# Patient Record
Sex: Male | Born: 1965 | Race: Black or African American | Hispanic: No | Marital: Married | Smoking: Never smoker
Health system: Southern US, Community
[De-identification: ages and names within clinical notes are randomized; demographics above are authoritative.]

## PROBLEM LIST (undated history)

## (undated) DIAGNOSIS — I10 Essential (primary) hypertension: Secondary | ICD-10-CM

## (undated) HISTORY — DX: Essential (primary) hypertension: I10

## (undated) HISTORY — PX: OTHER SURGICAL HISTORY: SHX169

---

## 2018-07-19 ENCOUNTER — Emergency Department (HOSPITAL_COMMUNITY)
Admission: EM | Admit: 2018-07-19 | Discharge: 2018-07-19 | Disposition: A | Discharge status: Home / Self Care | Payer: Self-pay | Attending: Emergency Medicine | Admitting: Emergency Medicine

## 2018-07-19 ENCOUNTER — Encounter (HOSPITAL_COMMUNITY): Payer: Self-pay | Admitting: Emergency Medicine

## 2018-07-19 ENCOUNTER — Other Ambulatory Visit: Payer: Self-pay

## 2018-07-19 ENCOUNTER — Emergency Department (HOSPITAL_COMMUNITY): Payer: Self-pay

## 2018-07-19 DIAGNOSIS — Z87891 Personal history of nicotine dependence: Secondary | ICD-10-CM | POA: Insufficient documentation

## 2018-07-19 DIAGNOSIS — R002 Palpitations: Secondary | ICD-10-CM | POA: Insufficient documentation

## 2018-07-19 DIAGNOSIS — Z79899 Other long term (current) drug therapy: Secondary | ICD-10-CM | POA: Insufficient documentation

## 2018-07-19 DIAGNOSIS — I10 Essential (primary) hypertension: Secondary | ICD-10-CM | POA: Insufficient documentation

## 2018-07-19 LAB — BASIC METABOLIC PANEL
Anion gap: 8 (ref 5–15)
BUN: 9 mg/dL (ref 6–20)
CHLORIDE: 107 mmol/L (ref 98–111)
CO2: 22 mmol/L (ref 22–32)
Calcium: 9 mg/dL (ref 8.9–10.3)
Creatinine, Ser: 0.98 mg/dL (ref 0.61–1.24)
GFR calc non Af Amer: >60 mL/min (ref >60)
Glucose, Bld: 143 mg/dL — ABNORMAL HIGH (ref 70–99)
POTASSIUM: 3.8 mmol/L (ref 3.5–5.1)
SODIUM: 137 mmol/L (ref 135–145)

## 2018-07-19 LAB — CBC
HEMATOCRIT: 43.3 % (ref 39.0–52.0)
Hemoglobin: 14.7 g/dL (ref 13.0–17.0)
MCH: 31.5 pg (ref 26.0–34.0)
MCHC: 33.9 g/dL (ref 30.0–36.0)
MCV: 92.7 fL (ref 80.0–100.0)
NRBC: 0 % (ref 0.0–0.2)
Platelets: 332 10*3/uL (ref 150–400)
RBC: 4.67 MIL/uL (ref 4.22–5.81)
RDW: 12.1 % (ref 11.5–15.5)
WBC: 8.1 10*3/uL (ref 4.0–10.5)

## 2018-07-19 LAB — TSH: TSH: 0.521 u[IU]/mL (ref 0.350–4.500)

## 2018-07-19 LAB — I-STAT TROPONIN, ED: Troponin i, poc: 0 ng/mL (ref 0.00–0.08)

## 2018-07-19 MED ORDER — METOPROLOL SUCCINATE ER 25 MG PO TB24: 12.5000 mg | ORAL_TABLET | Freq: Every day | ORAL | 0 refills | Status: DC

## 2018-07-19 NOTE — ED Notes (Signed)
Patient verbalizes understanding of discharge instructions. Opportunity for questioning and answers were provided. Armband removed by staff, pt discharged from ED ambulatory to home.  

## 2018-07-19 NOTE — Discharge Instructions (Signed)
1.  Call [facility] to schedule a follow-up appointment as soon as possible.  Use the referral number in your discharge instructions to find a family doctor for follow-up as soon as possible. 2.  Start taking Toprol-XL 12.5 mg daily.  Continue to monitor your blood pressures at home.  If you are feeling lightheaded and your blood pressures are less than 120s over 70s, discontinue the Toprol.  You should also monitor your heart rate and heart rate is going below 50 or you feel lightheaded or dizzy with heart rate less than 70, discontinue the Toprol. 3.  Return to the emergency department if you are having recurrence of symptoms, chest pain, passing out or near passing out episodes or other concerning symptoms.

## 2018-07-19 NOTE — ED Triage Notes (Signed)
PT took his aunts metoprolol at 2am

## 2018-07-19 NOTE — ED Triage Notes (Signed)
PT reports palpitations and HTN for 3-4 days.   PT reports he has been checking his BP at home.  180/110 was the highest reading at home. PT has never seen a doctor as an adult.   PT reports high HR last night and chest pain. This has been intermittent for past few days. Last night was the worst.   PT denies SOB, diaphoresis, other pain, nausea.

## 2018-07-19 NOTE — ED Provider Notes (Signed)
[facility]ENC[facility] Provider Note   CSN: [id number] Arrival date & time: 07/19/18  [id number]     History   Chief Complaint Chief Complaint  Patient presents with  . Hypertension    HPI Aaron Dillon is a 52 y.o. male.  HPI Patient reports he has been monitoring his blood pressure at home this week on a home monitor.  He reports blood pressures at home have been as high as 180\110.  He reports that for the last couple days now he has been noticing that he is gotten periods of time where he can feel his heart skipping or beating.  He reports last night it was particularly more pronounced.  That made him worried and he has decided to seek medical care.  Patient reports that he has not seen a doctor as an adult.  He has been otherwise healthy.  He reports that he took 1 of his family members metoprolol tablets yesterday evening and an aspirin.  He reports his symptoms did seem improved.  His blood pressures come down.  He is not currently having palpitations.  He denies he had any chest pain.  He denies that he had any syncope or near syncope.  He denies any associated shortness of breath, nausea or sweating.  Patient reports he does drink a moderate amount of alcohol.  Predominantly socially.  He reports he had been cutting back over the past few weeks because he had noticed he is getting a bit of a belly and thought he needed to be more conservative with his diet.  He reports on the recommendation of family members he did have a alcohol beverage last night to theoretically improve his blood pressure. History reviewed. No pertinent past medical history.  There are no active problems to display for this patient.   History reviewed. No pertinent surgical history.      Home Medications    Prior to Admission medications   Medication Sig Start Date End Date Taking? Authorizing Provider  metoprolol succinate (TOPROL-XL) 25 MG 24 hr tablet Take 0.5 tablets  (12.5 mg total) by mouth daily. 07/19/18   Arby BarrettePfeiffer, Maicy Filip, MD    Family History No family history on file.  Social History Social History   Tobacco Use  . Smoking status: Former Smoker  Substance Use Topics  . Alcohol use: Yes    Comment: last night   . Drug use: Yes    Types: Marijuana     Allergies   Patient has no known allergies.   Review of Systems Review of Systems 10 Systems reviewed and are negative for acute change except as noted in the HPI.\  Physical Exam Updated Vital Signs BP 127/76 (BP Location: Right Arm)   Pulse (!) 58   Temp 98.2 F (36.8 C) (Oral)   Resp (!) 21   Wt 99.8 kg   SpO2 99%   Physical Exam  Constitutional: He is oriented to person, place, and time. He appears well-developed and well-nourished. No distress.  HENT:  Head: Normocephalic and atraumatic.  Mouth/Throat: Oropharynx is clear and moist.  Eyes: Pupils are equal, round, and reactive to light. EOM are normal.  Neck: Neck supple.  Cardiovascular: Normal rate, regular rhythm, normal heart sounds and intact distal pulses.  Pulmonary/Chest: Effort normal and breath sounds normal.  Abdominal: Soft. Bowel sounds are normal. He exhibits no distension. There is no tenderness.  Musculoskeletal: Normal range of motion. He exhibits no edema.  Neurological: He is alert and oriented  to person, place, and time. He has normal strength. Coordination normal. GCS eye subscore is 4. GCS verbal subscore is 5. GCS motor subscore is 6.  Skin: Skin is warm, dry and intact.  Psychiatric: He has a normal mood and affect.     ED Treatments / Results  Labs (all labs ordered are listed, but only abnormal results are displayed) Labs Reviewed  BASIC METABOLIC PANEL - Abnormal; Notable for the following components:      Result Value   Glucose, Bld 143 (*)    All other components within normal limits  CBC  TSH  I-STAT TROPONIN, ED    EKG EKG Interpretation  Date/Time:  Thursday July 19 2018 09:45:58 EST Ventricular Rate:  82 PR Interval:    QRS Duration: 83 QT Interval:  390 QTC Calculation: 456 R Axis:   72 Text Interpretation:  Sinus rhythm normal, no old comparison Confirmed by Arby Barrette [phone]) on 07/19/2018 10:10:31 AM Also confirmed by Arby Barrette [phone]), editor Barbette Hair [phone])  on 07/19/2018 12:26:28 PM   Radiology Dg Chest 2 View  Result Date: 07/19/2018 CLINICAL DATA:  Chest pain EXAM: CHEST - 2 VIEW COMPARISON:  None. FINDINGS: Cardiomegaly. Minimal bibasilar atelectasis and low lung volumes. No effusions or overt edema. No acute bony abnormality. IMPRESSION: Mild cardiomegaly.  Low lung volumes with bibasilar atelectasis. Electronically Signed   By: Charlett Nose M.D.   On: 07/19/2018 10:56    Procedures Procedures (including critical care time)  Medications Ordered in ED Medications - No data to display   Initial Impression / Assessment and Plan / ED Course  I have reviewed the triage vital signs and the nursing notes.  Pertinent labs & imaging results that were available during my care of the patient were reviewed by me and considered in my medical decision making (see chart for details).    Patient is clinically well in appearance.  He did take her metoprolol yesterday.  Blood pressures have normalized.  He was getting elevated pressures greater than diastolic 170s and systolic 110s.  Now blood pressures are normalized and there are no ectopic beats on monitor.  Patient is asymptomatic.  His description of palpitations did not sound suggestive of ischemia or significant dysrhythmia.  He had no associated near syncope nor lightheadedness nor dyspnea nor chest pain.  As he seems to be tolerating the metoprolol well, I will have him continue this with the parameters of monitoring heart rate and blood pressure at home.  He is counseled on necessity of close follow-up and referral advice is provided.  Final Clinical Impressions(s) / ED  Diagnoses   Final diagnoses:  Essential hypertension  Palpitation    ED Discharge Orders         Ordered    Ambulatory referral to Cardiology     07/19/18 1234    metoprolol succinate (TOPROL-XL) 25 MG 24 hr tablet  Daily     07/19/18 1234           Arby Barrette, MD 07/19/18 1252

## 2018-08-22 ENCOUNTER — Ambulatory Visit (INDEPENDENT_AMBULATORY_CARE_PROVIDER_SITE_OTHER): Payer: Self-pay | Admitting: Cardiology

## 2018-08-22 ENCOUNTER — Encounter: Payer: Self-pay | Admitting: Cardiology

## 2018-08-22 ENCOUNTER — Encounter (INDEPENDENT_AMBULATORY_CARE_PROVIDER_SITE_OTHER): Payer: Self-pay

## 2018-08-22 VITALS — BP 146/90 | HR 77 | Ht 69.0 in | Wt 210.0 lb

## 2018-08-22 DIAGNOSIS — I10 Essential (primary) hypertension: Secondary | ICD-10-CM

## 2018-08-22 DIAGNOSIS — Z8249 Family history of ischemic heart disease and other diseases of the circulatory system: Secondary | ICD-10-CM

## 2018-08-22 DIAGNOSIS — R002 Palpitations: Secondary | ICD-10-CM

## 2018-08-22 MED ORDER — CARVEDILOL 6.25 MG PO TABS: 6.2500 mg | ORAL_TABLET | Freq: Two times a day (BID) | ORAL | 1 refills | Status: DC

## 2018-08-22 NOTE — Progress Notes (Signed)
`  PCP: System, Pcp Not In  Clinic Note: Chief Complaint  Patient presents with  . Hospitalization Follow-up    ER follow-up with hypertension and palpitations    HPI: Aaron Dillon is a 52 y.o. male with a PMH below who presents today for ER f/u for evaluation of hypertension and palpitations.Aaron Dillon.  Haitham Buelow has not yet established a primary care physician here locally.  He has been trying to establish in [place] and is planning to move here full-term.  He has not started a job full-time because he is about ready to leave to go to [place] to pick up his girlfriend at the end of the month to bring her back.  He has been doing intermittent jobs with a temp company.  Hopefully when he gets back he can get a more full-time job.  He has been told he has high blood pressure in the past, but has not been on any medications.  Recent Hospitalizations:   ER visit 07/19/18 -noted blood pressures as high as 180/1 110 mmHg.  This was going on for the last several days and was also noticing episodes of heart skipping and pounding.  He did take 1 of his family members metoprolol tablets when he noticed this happening to him and it seemed to settle things a little bit.  No other symptoms. -->  Referred for cardiology evaluation as opposed to PCP.  Studies Personally Reviewed - (if available, images/films reviewed: From Epic Chart or Care Everywhere)  None  Interval History: Aaron Dillon presents here today for follow-up.  He says that since starting the low-dose beta-blocker, the palpitations seem to have gotten better.  His blood pressures have not been as high.  He denies any headache or blurred vision or dizziness associated with it.  He has been under quite a bit of stress though because he is worried about his job situation especially with him having to go out to [place] to get his girlfriend to bring her back here.  He is worried that it is hard to set up a new home without a  job. He is try to cut down on his alcohol intake, partially because of cost and also because of weight gain.  He does not smoke cigarettes, but does smoke marijuana.  Even that he is cut back on mostly because of cost issues. He still has intermittent palpitations although the metoprolol is making it better.  He is not necessarily noticing fast heart rates, just frequent skipped beats that happened just about every day.  Sometimes they are more worrisome than others, but he just tries to put them out of his mind. He may get little short of breath and is walking around, but he does indicate that he is quite deconditioned without doing any real exercise.  No PND orthopnea and no edema.  Despite having the palpitations he is not had any syncope or near syncope type symptoms.  No TIA or amaurosis fugax. He has not had any chest tightness pressure with rest or exertion.  No PND, orthopnea or edema.  No claudication  ROS: A comprehensive was performed. Review of Systems  Constitutional: Negative for malaise/fatigue and weight loss.  HENT: Negative for nosebleeds.        He has several bad teeth that need to get fixed.  Respiratory: Positive for shortness of breath (Mostly if he overexerts because of deconditioning).   Gastrointestinal: Negative for abdominal pain, blood in stool, heartburn and melena.  Genitourinary: Negative for hematuria.  Musculoskeletal:       Off-and-on joint pains  Neurological: Negative for dizziness, focal weakness and headaches.  Psychiatric/Behavioral:       Lots of social stress with his job issue and moving his girlfriend out to [place]  All other systems reviewed and are negative.    I have reviewed and (if needed) personally updated the patient's problem list, medications, allergies, past medical and surgical history, social and family history.   Past Medical History:  Diagnosis Date  . Hypertension     Past Surgical History:  Procedure Laterality Date   . none      Current Meds  Medication Sig  . [DISCONTINUED] metoprolol succinate (TOPROL-XL) 25 MG 24 hr tablet Take 0.5 tablets (12.5 mg total) by mouth daily.    No Known Allergies  Social History   Tobacco Use  . Smoking status: Never Smoker  . Smokeless tobacco: Never Used  Substance Use Topics  . Alcohol use: Yes    Comment: last night  pt states he occasionally drinks beer and wine  . Drug use: Yes    Types: Marijuana    Comment: only smokes Marijuana - no regular cigarettes.   Social History   Social History Narrative   Recently moved to [place] -- working with [employer]. Currently unemployed.   Planning to go out to [place] to pick up his girlfriend & drive back here with her in early Jan 2020.   Hopes to get full time job after that.    family history includes Brain cancer in his mother; Breast cancer in his maternal grandmother; Heart attack (age of onset: 33) in his maternal grandfather.  Wt Readings from Last 3 Encounters:  08/22/18 210 lb (95.3 kg)  07/19/18 220 lb (99.8 kg)    PHYSICAL EXAM BP (!) 146/90   Pulse 77   Ht 5\' 9"  (1.753 m)   Wt 210 lb (95.3 kg)   BMI 31.01 kg/m  Physical Exam  Constitutional: He is oriented to person, place, and time. He appears well-developed and well-nourished. No distress.  Healthy-appearing.  Well-groomed.  HENT:  Head: Normocephalic and atraumatic.  Mouth/Throat: Oropharynx is clear and moist.  Relatively poor dentition.  Several areas of obvious tooth decay  Eyes: Pupils are equal, round, and reactive to light. Conjunctivae are normal. No scleral icterus.  Neck: Normal range of motion. Neck supple. No hepatojugular reflux and no JVD present. Carotid bruit is not present. No tracheal deviation present. No thyromegaly present.  Cardiovascular: Normal rate, regular rhythm, normal heart sounds and intact distal pulses.  Extrasystoles (Rare) are present. PMI is not displaced. Exam reveals no gallop and no friction  rub.  No murmur heard. Pulmonary/Chest: Effort normal. No respiratory distress. He has no wheezes. He has no rales.  Abdominal: Soft. Bowel sounds are normal. He exhibits no distension. There is no abdominal tenderness. There is no rebound.  Musculoskeletal: Normal range of motion.        General: No edema.  Neurological: He is alert and oriented to person, place, and time. No cranial nerve deficit.  Skin: Skin is warm and dry.  Multiple tattoos  Vitals reviewed.    Adult ECG Report  Rate: 72 ;  Rhythm: normal sinus rhythm and borderline LVH.;   Narrative Interpretation: Relatively normal EKG   Other studies Reviewed: Additional studies/ records that were reviewed today include:  Recent Labs:   Lab Results  Component Value Date   CREATININE 0.98 07/19/2018   BUN 9 07/19/2018  NA 137 07/19/2018   K 3.8 07/19/2018   CL 107 07/19/2018   CO2 22 07/19/2018   No results found for: CHOL, HDL, LDLCALC, LDLDIRECT, TRIG, CHOLHDL    ASSESSMENT / PLAN: Problem List Items Addressed This Visit    Essential hypertension (Chronic)    Blood pressure looks okay, but he is only on very low-dose Toprol.  I am the switch him from metoprolol to carvedilol for better blood pressure control. Plan: Carvedilol 6.25 mg twice daily      Relevant Medications   carvedilol (COREG) 6.25 MG tablet   Other Relevant Orders   Lipid panel   Family history of cardiovascular disease    With hypertension and family history of CAD, should do at least baseline evaluation with lipid panel.      Relevant Orders   Lipid panel   Comprehensive Metabolic Panel (CMET)   Palpitations - Primary    Relatively frequent although improved with low-dose beta-blocker, I think we may get better with increased dose of carvedilol, but will evaluate with a 24-hour monitor. Depending on results, may need to consider echo.      Relevant Orders   EKG 12-Lead (Completed)   HOLTER MONITOR - 24 HOUR   Lipid panel    Comprehensive Metabolic Panel (CMET)      I spent a total of with the patient and chart review. >  50% of the time was spent in direct patient consultation.   Current medicines are reviewed at length with the patient today.  (+/- concerns) n/a The following changes have been made:  see below   Patient Instructions  Medication Instructions:    FINISH TAKING THE TOPROL XL THEN START  TAKING CARVEDILOL (COREG) 6.25 MG TWICE A DAY  If you need a refill on your cardiac medications before your next appointment, please call your pharmacy.   Lab work: CMP  LIPID - DO NOT EAT OR DRINK THE MORNING OF THE TEST  If you have labs (blood work) drawn today and your tests are completely normal, you will receive your results only by: Marland Kitchen MyChart Message (if you have MyChart) OR . A paper copy in the mail If you have any lab test that is abnormal or we need to change your treatment, we will call you to review the results.  Testing/Procedures: SCHEDULE AT [address] Your physician has recommended that you wear a  24 HOUR  holter monitor. Holter monitors are medical devices that record the heart's electrical activity. Doctors most often use these monitors to diagnose arrhythmias. Arrhythmias are problems with the speed or rhythm of the heartbeat. The monitor is a small, portable device. You can wear one while you do your normal daily activities. This is usually used to diagnose what is causing palpitations/syncope (passing out).    Follow-Up: At [facility], you and your health needs are our priority.  As part of our continuing mission to provide you with exceptional heart care, we have created designated Provider Care Teams.  These Care Teams include your primary Cardiologist (physician) and Advanced Practice Providers (APPs -  Physician Assistants and Nurse Practitioners) who all work together to provide you with the care you need, when you need it. . Your  physician recommends that you schedule a follow-up appointment in 3 MONTHS WITH DR HARDING .   Any Other Special Instructions Will Be Listed Below (If Applicable).    Studies Ordered:   Orders Placed This Encounter  Procedures  .  Lipid panel  . Comprehensive Metabolic Panel (CMET)  . HOLTER MONITOR - 24 HOUR  . EKG 12-Lead      Bryan Lemma, M.D., M.S. Interventional Cardiologist   Pager # [phone] Phone # [phone] [address] [place], [place] [address]   Thank you for choosing Heartcare at [facility]!!

## 2018-08-22 NOTE — Patient Instructions (Signed)
Medication Instructions:    FINISH TAKING THE TOPROL XL THEN START  TAKING CARVEDILOL (COREG) 6.25 MG TWICE A DAY  If you need a refill on your cardiac medications before your next appointment, please call your pharmacy.   Lab work: CMP  LIPID - DO NOT EAT OR DRINK THE MORNING OF THE TEST  If you have labs (blood work) drawn today and your tests are completely normal, you will receive your results only by: Marland Kitchen. MyChart Message (if you have MyChart) OR . A paper copy in the mail If you have any lab test that is abnormal or we need to change your treatment, we will call you to review the results.  Testing/Procedures: SCHEDULE AT [address] Your physician has recommended that you wear a  24 HOUR  holter monitor. Holter monitors are medical devices that record the heart's electrical activity. Doctors most often use these monitors to diagnose arrhythmias. Arrhythmias are problems with the speed or rhythm of the heartbeat. The monitor is a small, portable device. You can wear one while you do your normal daily activities. This is usually used to diagnose what is causing palpitations/syncope (passing out).    Follow-Up: At [facility], you and your health needs are our priority.  As part of our continuing mission to provide you with exceptional heart care, we have created designated Provider Care Teams.  These Care Teams include your primary Cardiologist (physician) and Advanced Practice Providers (APPs -  Physician Assistants and Nurse Practitioners) who all work together to provide you with the care you need, when you need it. . Your physician recommends that you schedule a follow-up appointment in 3 MONTHS WITH DR HARDING .   Any Other Special Instructions Will Be Listed Below (If Applicable).

## 2018-08-24 ENCOUNTER — Ambulatory Visit (INDEPENDENT_AMBULATORY_CARE_PROVIDER_SITE_OTHER): Payer: Self-pay

## 2018-08-24 ENCOUNTER — Encounter: Payer: Self-pay | Admitting: Cardiology

## 2018-08-24 DIAGNOSIS — R002 Palpitations: Secondary | ICD-10-CM | POA: Insufficient documentation

## 2018-08-24 DIAGNOSIS — Z8249 Family history of ischemic heart disease and other diseases of the circulatory system: Secondary | ICD-10-CM | POA: Insufficient documentation

## 2018-08-24 NOTE — Assessment & Plan Note (Signed)
Relatively frequent although improved with low-dose beta-blocker, I think we may get better with increased dose of carvedilol, but will evaluate with a 24-hour monitor. Depending on results, may need to consider echo.

## 2018-08-24 NOTE — Assessment & Plan Note (Signed)
Blood pressure looks okay, but he is only on very low-dose Toprol.  I am the switch him from metoprolol to carvedilol for better blood pressure control. Plan: Carvedilol 6.25 mg twice daily

## 2018-08-24 NOTE — Assessment & Plan Note (Signed)
With hypertension and family history of CAD, should do at least baseline evaluation with lipid panel.

## 2018-08-28 LAB — COMPREHENSIVE METABOLIC PANEL
A/G RATIO: 2.1 (ref 1.2–2.2)
ALT: 33 IU/L (ref 0–44)
AST: 18 IU/L (ref 0–40)
Albumin: 4.9 g/dL (ref 3.5–5.5)
Alkaline Phosphatase: 59 IU/L (ref 39–117)
BUN/Creatinine Ratio: 14 (ref 9–20)
BUN: 13 mg/dL (ref 6–24)
Bilirubin Total: 0.3 mg/dL (ref 0.0–1.2)
CALCIUM: 9.8 mg/dL (ref 8.7–10.2)
CO2: 22 mmol/L (ref 20–29)
Chloride: 100 mmol/L (ref 96–106)
Creatinine, Ser: 0.9 mg/dL (ref 0.76–1.27)
GFR, EST AFRICAN AMERICAN: 112 mL/min/{1.73_m2} (ref >59)
GFR, EST NON AFRICAN AMERICAN: 97 mL/min/{1.73_m2} (ref >59)
Globulin, Total: 2.3 g/dL (ref 1.5–4.5)
Glucose: 119 mg/dL — ABNORMAL HIGH (ref 65–99)
Potassium: 4.2 mmol/L (ref 3.5–5.2)
Sodium: 140 mmol/L (ref 134–144)
Total Protein: 7.2 g/dL (ref 6.0–8.5)

## 2018-08-28 LAB — LIPID PANEL
CHOL/HDL RATIO: 3.4 ratio (ref 0.0–5.0)
Cholesterol, Total: 200 mg/dL — ABNORMAL HIGH (ref 100–199)
HDL: 59 mg/dL (ref >39)
LDL Calculated: 114 mg/dL — ABNORMAL HIGH (ref 0–99)
TRIGLYCERIDES: 136 mg/dL (ref 0–149)
VLDL Cholesterol Cal: 27 mg/dL (ref 5–40)

## 2018-09-04 ENCOUNTER — Telehealth: Payer: Self-pay | Admitting: *Deleted

## 2018-09-04 NOTE — Telephone Encounter (Signed)
called to  Leave message - memory full on answering machine

## 2018-09-04 NOTE — Telephone Encounter (Signed)
-----   Message from Marykay Lexavid W Harding, MD sent at 09/03/2018  4:59 PM EST ----- Baseline risk evaluation with cholesterol panel showing LDL of 114.  Our target for him would be an LDL less than 100.  We can see if this can be done with adjusting diet and increasing exercise.  Otherwise may need to consider treating. Relatively normal chemistry panel with normal kidney and liver function.  Blood sugar levels are elevated.  Need to establish primary care to evaluate for possible diabetes or prediabetes.  On follow-up check we will reassess with a hemoglobin A1c. Bryan Lemma.  David Harding, MD

## 2018-10-09 ENCOUNTER — Telehealth: Payer: Self-pay | Admitting: *Deleted

## 2018-10-09 NOTE — Telephone Encounter (Signed)
OPEN ERROR

## 2018-10-09 NOTE — Telephone Encounter (Signed)
LEFT MESSAGE WITH FAMILY MEMBER TO CALL BACK 

## 2018-11-22 ENCOUNTER — Telehealth: Payer: Self-pay | Admitting: *Deleted

## 2018-11-22 NOTE — Telephone Encounter (Signed)
   Primary Cardiologist:  Bryan Lemma, MD   Patient contacted.  History reviewed.  No symptoms to suggest any unstable cardiac conditions.  Based on discussion, with current pandemic situation, we will be postponing this appointment for @PATIENTNAME @.  If symptoms change, he has been instructed to contact our office.  Routing to C19 CANCEL pool for tracking (P CV DIV CV19 CANCEL).  Tobin Chad, RN  11/22/2018 12:20 PM         .

## 2018-11-28 ENCOUNTER — Ambulatory Visit: Payer: Self-pay | Admitting: Cardiology

## 2019-02-26 ENCOUNTER — Telehealth: Payer: Self-pay

## 2019-02-26 ENCOUNTER — Telehealth: Payer: Self-pay | Admitting: Cardiology

## 2019-02-26 MED ORDER — CARVEDILOL 6.25 MG PO TABS: 6.2500 mg | ORAL_TABLET | Freq: Two times a day (BID) | ORAL | 0 refills | Status: DC

## 2019-02-26 NOTE — Telephone Encounter (Signed)
Call pt to get verbal consent, Pt did not answer left pt vmail

## 2019-02-26 NOTE — Telephone Encounter (Signed)
Sent in for 60 day supply, advising patient to call to make appointment as he is overdue.

## 2019-02-26 NOTE — Telephone Encounter (Signed)
Mychart pending, verbal consent, pre reg complete 02/26/19 AF

## 2019-02-26 NOTE — Telephone Encounter (Signed)
New Message             *STAT* If patient is at the pharmacy, call can be transferred to refill team.   1. Which medications need to be refilled? (please list name of each medication and dose if known) Carvedilol   2. Which pharmacy/location (including street and city if local pharmacy) is medication to be sent to? [facility] [address] [phone]  3. Do they need a 30 day or 90 day supply? [place]

## 2019-02-27 ENCOUNTER — Telehealth (INDEPENDENT_AMBULATORY_CARE_PROVIDER_SITE_OTHER): Payer: Self-pay | Admitting: Cardiology

## 2019-02-27 ENCOUNTER — Ambulatory Visit: Payer: Self-pay | Admitting: Cardiology

## 2019-02-27 VITALS — Ht 69.0 in | Wt 220.0 lb

## 2019-02-27 DIAGNOSIS — I10 Essential (primary) hypertension: Secondary | ICD-10-CM

## 2019-02-27 DIAGNOSIS — Z8249 Family history of ischemic heart disease and other diseases of the circulatory system: Secondary | ICD-10-CM

## 2019-02-27 DIAGNOSIS — R002 Palpitations: Secondary | ICD-10-CM

## 2019-02-27 MED ORDER — CARVEDILOL 6.25 MG PO TABS: 6.2500 mg | ORAL_TABLET | Freq: Two times a day (BID) | ORAL | 3 refills | Status: DC

## 2019-02-27 NOTE — Patient Instructions (Addendum)
Medication Instructions:   No change in meds for now. We will refill your Carvedilol - PRESCRIPTION SENT TO PHARMACY  I would like for you to check you BP at least 2-3 times a month to make sure that it isn't getting too high.  (target < 135/80 mmHg) -> if running high consistently - call in for instructions.   If you need a refill on your cardiac medications before your next appointment, please call your pharmacy.   Lab work:  Recheck Lipids & CMP in ~ [place] 2020  If you have labs (blood work) drawn today and your tests are completely normal, you will receive your results only by: Marland Kitchen MyChart Message (if you have MyChart) OR . A paper copy in the mail If you have any lab test that is abnormal or we need to change your treatment, we will call you to review the results.  Testing/Procedures:  No new studies   Follow-Up: At [facility], you and your health needs are our priority.  As part of our continuing mission to provide you with exceptional heart care, we have created designated Provider Care Teams.  These Care Teams include your primary Cardiologist (physician) and Advanced Practice Providers (APPs -  Physician Assistants and Nurse Practitioners) who all work together to provide you with the care you need, when you need it. . You will need a follow up appointment in 8  Months- FEB 2021.  Please call our office 2 months in advance to schedule this appointment.  You may see Glenetta Hew, MD or one of the following Advanced Practice Providers on your designated Care Team:   . Rosaria Ferries, PA-C . Jory Sims, DNP, ANP  Any Other Special Instructions Will Be Listed Below (If Applicable).

## 2019-02-27 NOTE — Progress Notes (Signed)
Virtual Visit via Telephone Note   This visit type was conducted due to national recommendations for restrictions regarding the COVID-19 Pandemic (e.g. social distancing) in an effort to limit this patient's exposure and mitigate transmission in our community.  Due to his co-morbid illnesses, this patient is at least at moderate risk for complications without adequate follow up.  This format is felt to be most appropriate for this patient at this time.  The patient did not have access to video technology/had technical difficulties with video requiring transitioning to audio format only (telephone).  All issues noted in this document were discussed and addressed.  No physical exam could be performed with this format.  Please refer to the patient's chart for his  consent to telehealth for [facility].   Patient has given verbal permission to conduct this visit via virtual appointment and to bill insurance 02/27/2019 4:58 PM     Evaluation Performed:  Follow-up visit  Date:  02/27/2019   ID:  Aaron HensenClarence Schaffer, DOB [birth year], MRN [id number]  Patient Location: Home Provider Location: Home  PCP:  System, Pcp Not In  Cardiologist:  Bryan Lemmaavid Jessie Cowher, MD  Electrophysiologist:  None   Chief Complaint: 2455-month follow-up-hypertension and palpitations  History of Present Illness:    Aaron Dillon is a 53 y.o. male with PMH notable for hypertension who presents via audio/video conferencing for a telehealth visit today.  Aaron HensenClarence Toutant was last seen back in December 2019 for ER follow-up where he was noted to be hypertensive with blood pressures in the 100-410 range.  He also noted heart skipping and pounding. --> Carvedilol 6.25 mg twice daily. --> Ordered lipid panel and chemistry panel because of family history of CAD  Interval History:  Clinically doing very well.  He says ever since he started taking carvedilol, the palpitations have really gone away.  He has been feeling fine.   [place] is back now to [place] with his wife getting settled in.  They are now the job.  He is doing fine with the COVID-19 restrictions, it just makes it hard to find a job. Otherwise required standpoint he was doing great the palpitations have been much been nonexistent.  Remainder of cardiovascular ROS: no chest pain or dyspnea on exertion negative for - edema, irregular heartbeat, orthopnea, palpitations, paroxysmal nocturnal dyspnea, rapid heart rate, shortness of breath or Syncope/near syncope, TIA, orthopedics.  The patient does not have symptoms concerning for COVID-19 infection (fever, chills, cough, or new shortness of breath).  The patient is practicing social distancing.  ROS:  Please see the history of present illness.    Review of Systems  Constitutional: Negative for fever and malaise/fatigue.  HENT: Negative for congestion and nosebleeds.   Eyes: Negative for blurred vision and double vision.  Respiratory: Negative for cough and shortness of breath.   Gastrointestinal: Negative for blood in stool, melena, nausea and vomiting.  Genitourinary: Negative for hematuria.  Musculoskeletal: Negative for joint pain.  Neurological: Negative for dizziness, tingling, focal weakness and headaches.    Past Medical History:  Diagnosis Date   Hypertension    Past Surgical History:  Procedure Laterality Date   none       Current Meds  Medication Sig   carvedilol (COREG) 6.25 MG tablet Take 1 tablet (6.25 mg total) by mouth 2 (two) times daily. Please call to make appointment- Overdue.     Allergies:   Patient has no known allergies.   Social History   Tobacco Use  Smoking status: Never Smoker   Smokeless tobacco: Never Used  Substance Use Topics   Alcohol use: Yes    Comment: last night    pt states he occasionally drinks beer and wine   Drug use: Yes    Types: Marijuana    Comment: only smokes Marijuana - no regular cigarettes.     Family Hx: The patient's  family history includes Brain cancer in his mother; Breast cancer in his maternal grandmother; Heart attack (age of onset: 83) in his maternal grandfather.   Prior CV studies:   The following studies were reviewed today:  Holter Monitor - 1 short PAT burst.  Otw Mostly NSR.  Rate PACs & minimal PVCs  Labs/Other Tests and Data Reviewed:    EKG:  No ECG reviewed.  Recent Labs: 07/19/2018: Hemoglobin 14.7; Platelets 332; TSH 0.521 08/28/2018: ALT 33; BUN 13; Creatinine, Ser 0.90; Potassium 4.2; Sodium 140   Recent Lipid Panel Lab Results  Component Value Date/Time   CHOL 200 (H) 08/28/2018 08:13 AM   TRIG 136 08/28/2018 08:13 AM   HDL 59 08/28/2018 08:13 AM   CHOLHDL 3.4 08/28/2018 08:13 AM   LDLCALC 114 (H) 08/28/2018 08:13 AM    Wt Readings from Last 3 Encounters:  02/27/19 220 lb (99.8 kg)  08/22/18 210 lb (95.3 kg)  07/19/18 220 lb (99.8 kg)     Objective:    Vital Signs:  Ht 5\' 9"  (1.753 m)    Wt 220 lb (99.8 kg)    BMI 32.49 kg/m   VITAL SIGNS:  reviewed GEN:  no acute distress RESPIRATORY:  non-labored NEURO:  A&O x 3.  Answers ?s appropriately.   PSYCH:  normal mood & affect   ASSESSMENT & PLAN:    Problem List Items Addressed This Visit    Palpitations (Chronic)   Family history of cardiovascular disease (Chronic)   Essential hypertension - Primary (Chronic)      Hhe doing very well.    Blood pressure has not been checked recently.  Have asked that he try to figure out a way to get his blood pressure checked at least once a week to see where he is running.  We may need to titrate up his carvedilol dose, but I think he is probably stable.  Not having any symptoms of hypertension. -->  If the pressures do tend to run high, I have asked that he call back in and we can make adjustments including possibly increasing carvedilol which would be preferred adding a new medicine.  Palpitations are pretty much controlled with current dose of carvedilol.  I did tell  him that if he has a breakthrough spell, he can take an additional dose.  Lipid panel is not quite at goal, we talked about dietary adjustments and increased exercise.  We will recheck lipids and chemistries prior to his follow-up in roughly 8 months.  Total cholesterol 200 is acceptable with an HDL of 59.  LDL goal should be less than 100.    COVID-19 Education: The signs and symptoms of COVID-19 were discussed with the patient and how to seek care for testing (follow up with PCP or arrange E-visit).   The importance of social distancing was discussed today.  Time:   Today, I have spent 14 minutes with the patient with telehealth technology discussing the above problems.  5 min - chart review & charting   Medication Adjustments/Labs and Tests Ordered: Current medicines are reviewed at length with the patient today.  Concerns  regarding medicines are outlined above.  Medication Instructions:   No change in meds for now. We will refill your Carvedilol   I would like for you to check you BP at least 2-3 times a month to make sure that it isn't getting too high.  (target < 135/80 mmHg) -> if running high consistently - call in for instructions.   Tests Ordered: No orders of the defined types were placed in this encounter.  Recheck Lipids & CMP in ~ Dec-Jan  Medication Changes: No orders of the defined types were placed in this encounter.   Disposition:  Follow up in 8 month(s)    Signed, Bryan Lemmaavid Jocee Kissick, MD  02/27/2019 4:58 PM    [facility]

## 2019-02-28 ENCOUNTER — Other Ambulatory Visit: Payer: Self-pay | Admitting: Cardiology

## 2019-02-28 ENCOUNTER — Encounter: Payer: Self-pay | Admitting: Cardiology

## 2019-10-28 ENCOUNTER — Telehealth: Payer: Self-pay | Admitting: *Deleted

## 2019-10-28 DIAGNOSIS — R002 Palpitations: Secondary | ICD-10-CM

## 2019-10-28 DIAGNOSIS — Z8249 Family history of ischemic heart disease and other diseases of the circulatory system: Secondary | ICD-10-CM

## 2019-10-28 DIAGNOSIS — I10 Essential (primary) hypertension: Secondary | ICD-10-CM

## 2019-10-28 NOTE — Telephone Encounter (Signed)
Mailed letter and lab slip , need to contact office for follow up appt

## 2019-11-11 IMAGING — CR DG CHEST 2V
2 series · 2 of 2 positions shown · non-contrast
Comparison: None.

CLINICAL DATA: Chest pain

EXAM:
CHEST - 2 VIEW

[chest pa]
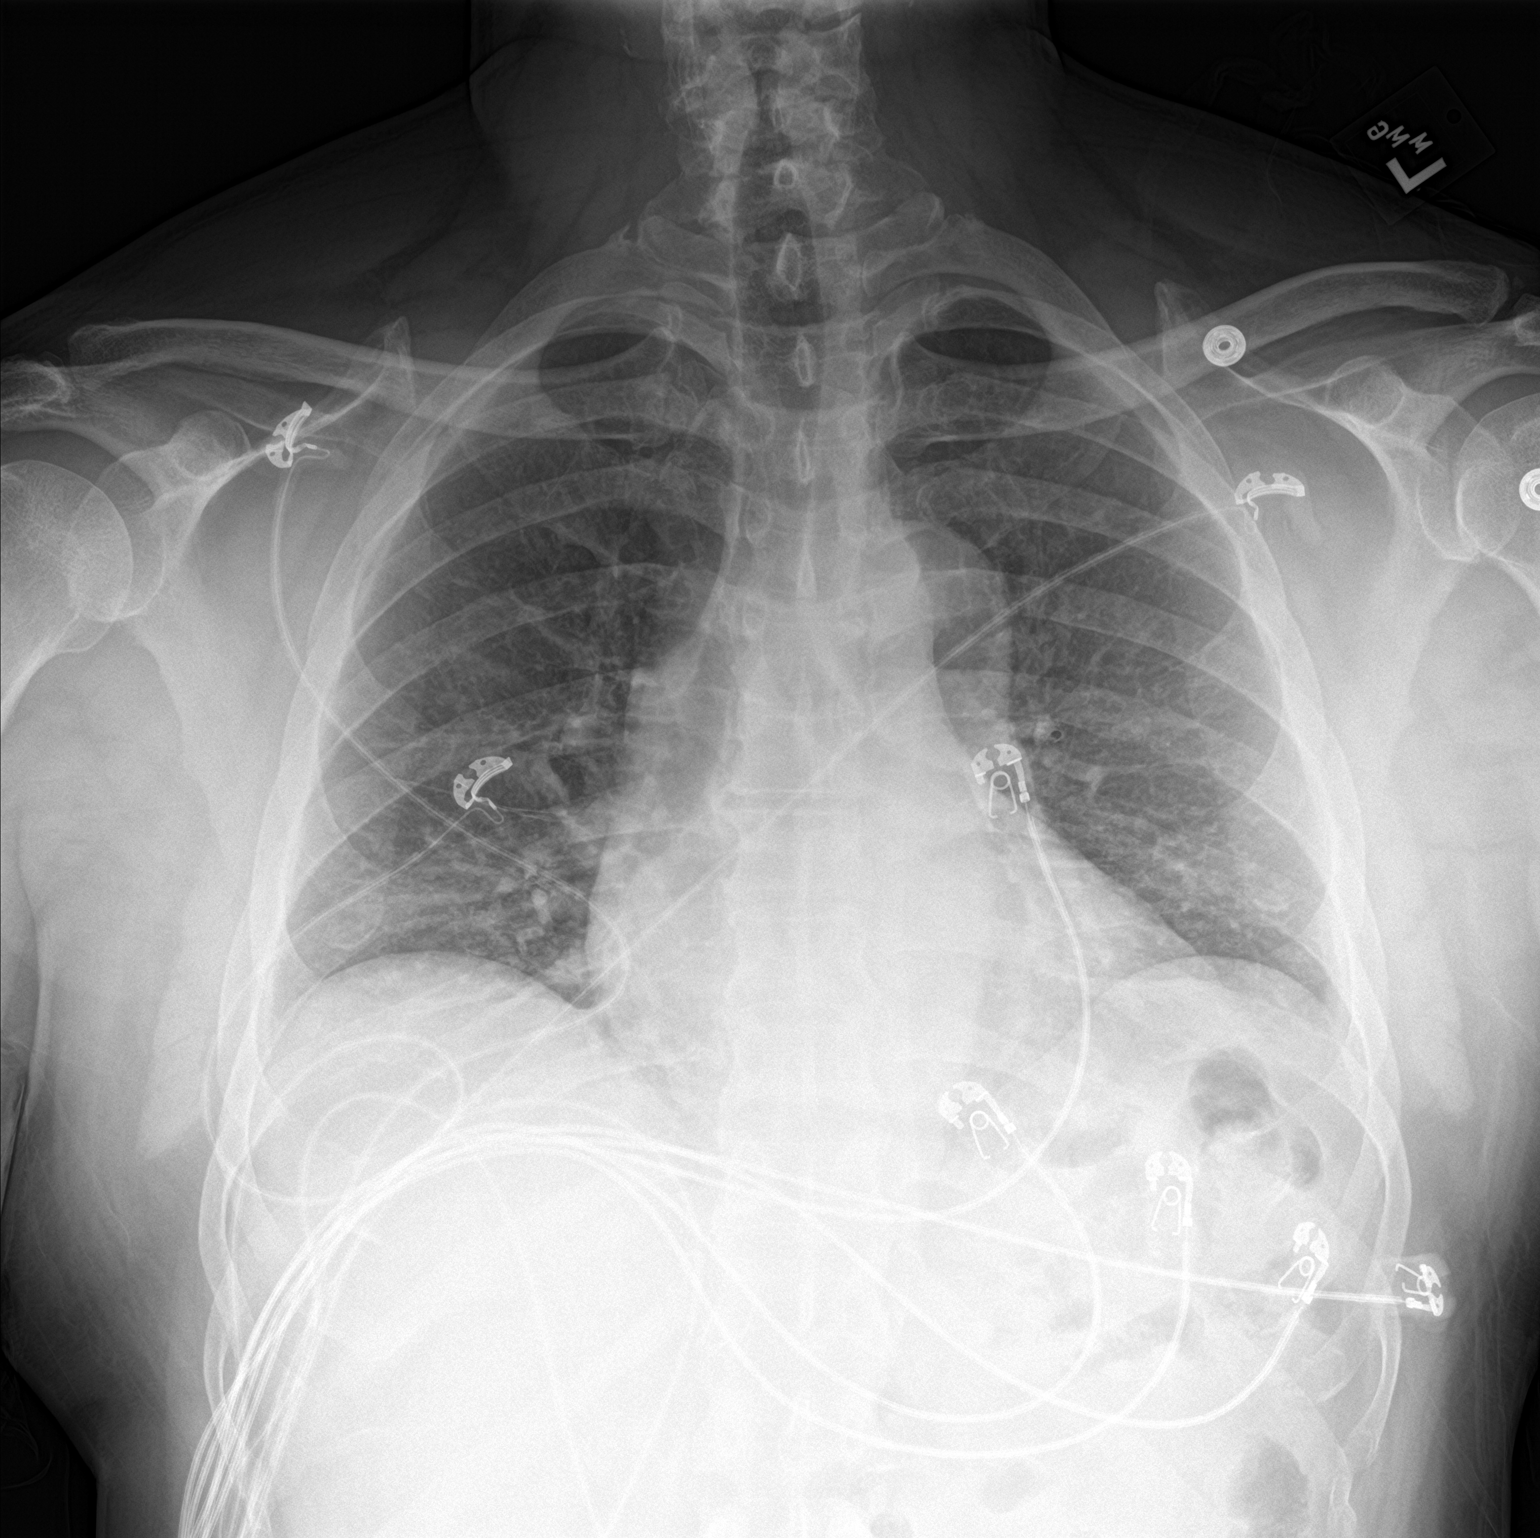

[chest lat]
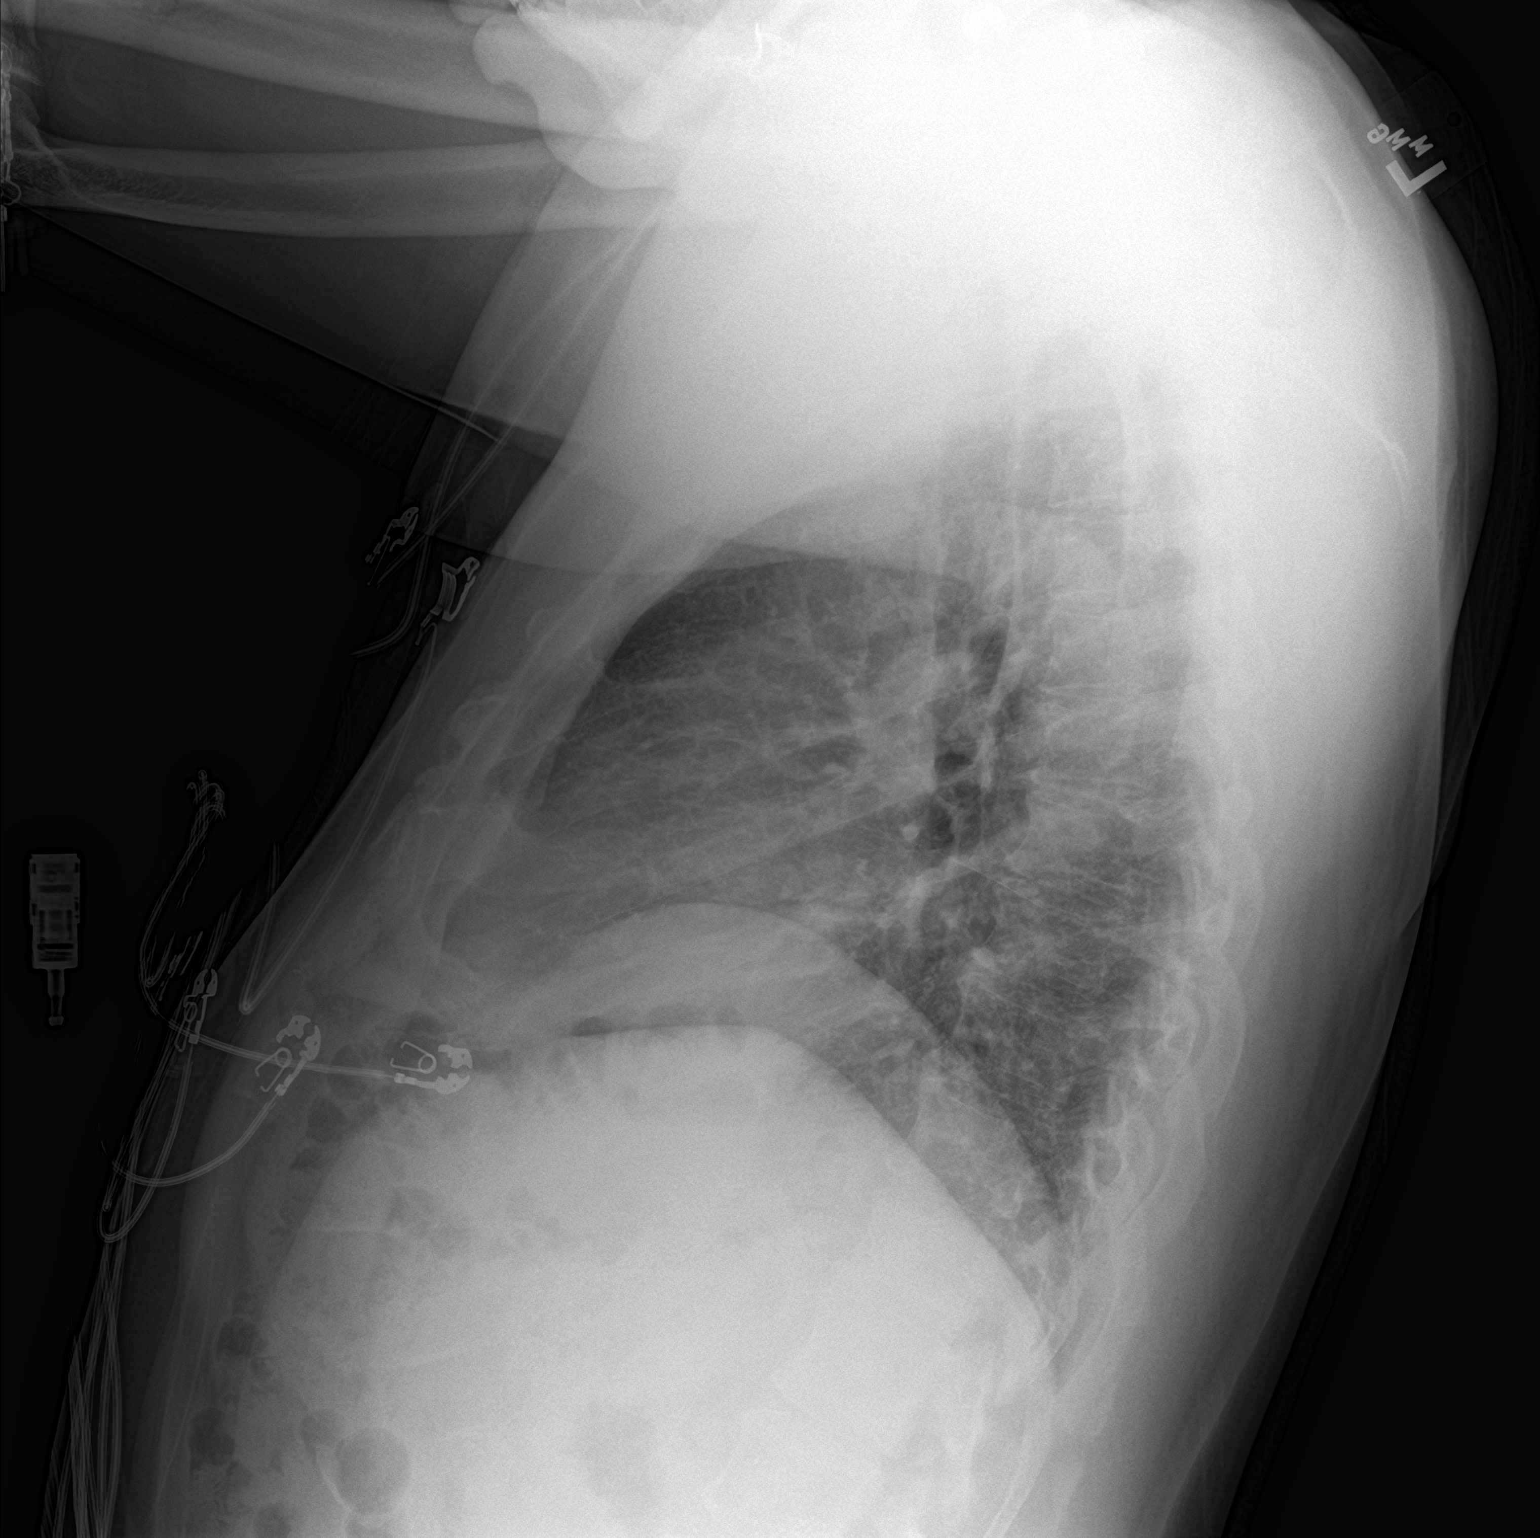

[2 of 2 positions shown; findings below may reference images not displayed]

FINDINGS: Cardiomegaly. Minimal bibasilar atelectasis and low lung volumes. No
effusions or overt edema. No acute bony abnormality.
IMPRESSION: Mild cardiomegaly.  Low lung volumes with bibasilar atelectasis.

## 2019-12-11 ENCOUNTER — Encounter: Payer: Self-pay | Admitting: General Practice

## 2020-03-10 ENCOUNTER — Other Ambulatory Visit: Payer: Self-pay | Admitting: Cardiology

## 2020-03-11 ENCOUNTER — Encounter: Payer: Self-pay | Admitting: Adult Health

## 2020-03-11 ENCOUNTER — Ambulatory Visit (INDEPENDENT_AMBULATORY_CARE_PROVIDER_SITE_OTHER): Payer: Managed Care, Other (non HMO) | Admitting: Adult Health

## 2020-03-11 ENCOUNTER — Other Ambulatory Visit: Payer: Self-pay

## 2020-03-11 VITALS — BP 126/78 | HR 76 | Ht 69.0 in | Wt 187.0 lb

## 2020-03-11 DIAGNOSIS — I10 Essential (primary) hypertension: Secondary | ICD-10-CM

## 2020-03-11 NOTE — Patient Instructions (Signed)
Medication Instructions:  Continue current medications  *If you need a refill on your cardiac medications before your next appointment, please call your pharmacy*   Lab Work: None Ordered  Testing/Procedures: None ordered   Follow-Up: At [facility], you and your health needs are our priority.  As part of our continuing mission to provide you with exceptional heart care, we have created designated Provider Care Teams.  These Care Teams include your primary Cardiologist (physician) and Advanced Practice Providers (APPs -  Physician Assistants and Nurse Practitioners) who all work together to provide you with the care you need, when you need it.  We recommend signing up for the patient portal called "MyChart".  Sign up information is provided on this After Visit Summary.  MyChart is used to connect with patients for Virtual Visits (Telemedicine).  Patients are able to view lab/test results, encounter notes, upcoming appointments, etc.  Non-urgent messages can be sent to your provider as well.   To learn more about what you can do with MyChart, go to [email or web address].    Your next appointment:   1 year(s)  The format for your next appointment:   In Person  Provider:   You may see David Harding, MD or one of the following Advanced Practice Providers on your designated Care Team:    Rhonda Barrett, PA-C  Kathryn Lawrence, DNP, ANP  Cadence Furth, NP    

## 2020-03-11 NOTE — Progress Notes (Signed)
Cardiology Office Note   Date:  03/11/2020   ID:  Aaron Dillon, DOB [birth year], MRN [id number]  PCP:  System, Pcp Not In  Cardiologist:  Dr. Herbie Baltimore CC: Hypertension 1 year follow-up   History of Present Illness: Aaron Dillon is a 54 y.o. male who presents for ongoing assessment and management of hypertension and hyperlipidemia, for annual follow-up.  He was last seen by Dr. Herbie Baltimore on 02/27/2019 and was doing well with good control of blood pressure.  He remains on carvedilol 6.25 mg twice daily.  He was advised on dietary adjustments and to increase exercise with fasting lipids and LFTs along with chemistries to be completed on follow-up if not already done by PCP.:  Goal of LDL less than 100.  He comes today without any complaints.  He has had a recent physical exam by PCP, Rueben Bash, PA-C, at [facility], [place].  He states that his labs were drawn and was told everything was normal.  He denies any chest pain, dizziness, dyspnea on exertion, fatigue, blood pressure elevations.  He has been medically compliant and is here for refills.  Past Medical History:  Diagnosis Date   Hypertension     Past Surgical History:  Procedure Laterality Date   none       Current Outpatient Medications  Medication Sig Dispense Refill   carvedilol (COREG) 6.25 MG tablet TAKE 1 TABLET(6.25 MG) BY MOUTH TWICE DAILY 180 tablet 0   No current facility-administered medications for this visit.    Allergies:   Patient has no known allergies.    Social History:  The patient  reports that he has never smoked. He has never used smokeless tobacco. He reports current alcohol use. He reports current drug use. Drug: Marijuana.   Family History:  The patient's family history includes Brain cancer in his mother; Breast cancer in his maternal grandmother; Heart attack (age of onset: 51) in his maternal grandfather.    ROS: All other systems are reviewed and  negative. Unless otherwise mentioned in H&P    PHYSICAL EXAM: VS:  BP 126/78    Pulse 76    Ht 5\' 9"  (1.753 m)    Wt 187 lb (84.8 kg)    SpO2 98%    BMI 27.62 kg/m  , BMI Body mass index is 27.62 kg/m. GEN: Well nourished, well developed, in no acute distress HEENT: normal Neck: no JVD, carotid bruits, or masses Cardiac: RRR; no murmurs, rubs, or gallops,no edema  Respiratory:  Clear to auscultation bilaterally, normal work of breathing GI: soft, nontender, nondistended, + BS MS: no deformity or atrophy Skin: warm and dry, no rash Neuro:  Strength and sensation are intact Psych: euthymic mood, full affect   EKG: Normal sinus rhythm, heart rate 74 bpm.  (Personally reviewed)   Recent Labs: No results found for requested labs within last 8760 hours.    Lipid Panel    Component Value Date/Time   CHOL 200 (H) 08/28/2018 0813   TRIG 136 08/28/2018 0813   HDL 59 08/28/2018 0813   CHOLHDL 3.4 08/28/2018 0813   LDLCALC 114 (H) 08/28/2018 0813      Wt Readings from Last 3 Encounters:  03/11/20 187 lb (84.8 kg)  02/27/19 220 lb (99.8 kg)  08/22/18 210 lb (95.3 kg)      ASSESSMENT AND PLAN:  1.  Hypertension: Blood pressures well controlled on carvedilol 6.25 mg twice daily.  He is providing 90-day prescription with 3 refills.  He will  be seen again in 1 year.  Her blood pressure so stable, is likely he can follow-up with PCP for management.   Current medicines are reviewed at length with the patient today.  I have spent 20 minutes dedicated to the care of this patient on the date of this encounter to include pre-visit review of records, assessment, management and diagnostic testing,with shared decision making.  Labs/ tests ordered today include: None Bettey Mare. Liborio Nixon, ANP, AACC   03/11/2020 4:34 PM    [facility] [address] Office [phone] Fax [phone]  Notice: This dictation was prepared with Dragon dictation  along with smaller phrase technology. Any transcriptional errors that result from this process are unintentional and may not be corrected upon review.

## 2020-06-13 ENCOUNTER — Other Ambulatory Visit: Payer: Self-pay | Admitting: Cardiology

## 2020-09-12 ENCOUNTER — Other Ambulatory Visit: Payer: Self-pay | Admitting: Cardiology
